# Patient Record
Sex: Male | Born: 1990 | Hispanic: No | Marital: Single
Health system: Southern US, Community
[De-identification: ages and names within clinical notes are randomized; demographics above are authoritative.]

---

## 2010-01-28 ENCOUNTER — Emergency Department (HOSPITAL_COMMUNITY)
Admission: EM | Admit: 2010-01-28 | Discharge: 2010-01-28 | Payer: Self-pay | Source: Home / Self Care | Admitting: Emergency Medicine

## 2010-02-02 ENCOUNTER — Emergency Department (HOSPITAL_COMMUNITY)
Admission: EM | Admit: 2010-02-02 | Discharge: 2010-02-02 | Payer: Self-pay | Source: Home / Self Care | Admitting: Emergency Medicine
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
## 2021-03-26 ENCOUNTER — Emergency Department (HOSPITAL_COMMUNITY): Admission: EM | Admit: 2021-03-26 | Payer: Self-pay | Source: Home / Self Care

## 2021-03-26 ENCOUNTER — Other Ambulatory Visit: Payer: Self-pay

## 2021-03-26 ENCOUNTER — Encounter (HOSPITAL_COMMUNITY): Payer: Self-pay | Admitting: Emergency Medicine

## 2021-03-26 NOTE — ED Triage Notes (Signed)
Patient's supervisor requesting drug screening for patient .
